# Patient Record
Sex: Female | Born: 1990 | Race: White | Hispanic: No | Marital: Single | Smoking: Never smoker
Health system: Southern US, Community
[De-identification: ages and names within clinical notes are randomized; demographics above are authoritative.]

## PROBLEM LIST (undated history)

## (undated) DIAGNOSIS — F329 Major depressive disorder, single episode, unspecified: Secondary | ICD-10-CM

## (undated) DIAGNOSIS — F32A Depression, unspecified: Secondary | ICD-10-CM

## (undated) HISTORY — DX: Major depressive disorder, single episode, unspecified: F32.9

## (undated) HISTORY — DX: Depression, unspecified: F32.A

---

## 2011-09-07 ENCOUNTER — Ambulatory Visit (INDEPENDENT_AMBULATORY_CARE_PROVIDER_SITE_OTHER): Payer: PRIVATE HEALTH INSURANCE

## 2011-09-07 DIAGNOSIS — J069 Acute upper respiratory infection, unspecified: Secondary | ICD-10-CM

## 2011-12-06 ENCOUNTER — Ambulatory Visit (INDEPENDENT_AMBULATORY_CARE_PROVIDER_SITE_OTHER): Payer: PRIVATE HEALTH INSURANCE | Admitting: Internal Medicine

## 2011-12-06 VITALS — BP 110/68 | HR 68 | Temp 98.2°F | Resp 16 | Ht 64.0 in | Wt 129.0 lb

## 2011-12-06 DIAGNOSIS — J039 Acute tonsillitis, unspecified: Secondary | ICD-10-CM

## 2011-12-06 DIAGNOSIS — F39 Unspecified mood [affective] disorder: Secondary | ICD-10-CM

## 2011-12-06 DIAGNOSIS — R059 Cough, unspecified: Secondary | ICD-10-CM

## 2011-12-06 DIAGNOSIS — J00 Acute nasopharyngitis [common cold]: Secondary | ICD-10-CM

## 2011-12-06 DIAGNOSIS — R05 Cough: Secondary | ICD-10-CM

## 2011-12-06 MED ORDER — HYDROCODONE-ACETAMINOPHEN 7.5-500 MG/15ML PO SOLN: 5.0000 mL | Freq: Four times a day (QID) | ORAL | Status: AC | PRN

## 2011-12-06 MED ORDER — AZITHROMYCIN 500 MG PO TABS: 500.0000 mg | ORAL_TABLET | Freq: Every day | ORAL | Status: AC

## 2011-12-06 NOTE — Progress Notes (Signed)
  Subjective:    Patient ID: Patricia Banks, female    DOB: 1991/01/15, 21 y.o.   MRN: [id number]  HPI Has 2-3d of very st, congestion with bloody green disch, body aches, and mild cough    Review of Systems    mood disorder Objective:   Physical Exam  Constitutional: She is oriented to person, place, and time. She appears well-developed and well-nourished.  HENT:  Right Ear: Tympanic membrane normal.  Left Ear: Tympanic membrane normal.  Nose: Mucosal edema, rhinorrhea and sinus tenderness present. Right sinus exhibits no maxillary sinus tenderness and no frontal sinus tenderness. Left sinus exhibits no maxillary sinus tenderness and no frontal sinus tenderness.  Mouth/Throat: Uvula is midline and mucous membranes are normal. Uvula swelling present. Oropharyngeal exudate, posterior oropharyngeal edema and posterior oropharyngeal erythema present.  Eyes: EOM are normal.  Neck: Neck supple.  Cardiovascular: Normal rate.   Pulmonary/Chest: Effort normal and breath sounds normal.  Lymphadenopathy:    She has cervical adenopathy.  Neurological: She is alert and oriented to person, place, and time.  Skin: Rash noted.  Psychiatric: She has a normal mood and affect.   RST  Results for orders placed in visit on 12/06/11  POCT RAPID STREP A (OFFICE)      Component Value Range   Rapid Strep A Screen Negative  Negative       Assessment & Plan:  Tonsillitis Cough  Zithromax 500mg  Lortab elixir 6oz prn

## 2011-12-06 NOTE — Patient Instructions (Signed)
Tonsillitis Tonsils are lumps of lymphoid tissues at the back of the throat. Each tonsil has 20 crevices (crypts). Tonsils help fight nose and throat infections and keep infection from spreading to other parts of the body for the first 18 months of life. Tonsillitis is an infection of the throat that causes the tonsils to become red, tender, and swollen. CAUSES Sudden and, if treated, temporary (acute) tonsillitis is usually caused by infection with streptococcal bacteria. Long lasting (chronic) tonsillitis occurs when the crypts of the tonsils become filled with pieces of food and bacteria, which makes it easy for the tonsils to become constantly infected. SYMPTOMS  Symptoms of tonsillitis include:  A sore throat.   White patches on the tonsils.   Fever.   Tiredness.  DIAGNOSIS Tonsillitis can be diagnosed through a physical exam. Diagnosis can be confirmed with the results of lab tests, including a throat culture. TREATMENT  The goals of tonsillitis treatment include the reduction of the severity and duration of symptoms, prevention of associated conditions, and prevention of disease transmission. Tonsillitis caused by bacteria can be treated with antibiotics. Usually, treatment with antibiotics is started before the cause of the tonsillitis is known. However, if it is determined that the cause is not bacterial, antibiotics will not treat the tonsillitis. If attacks of tonsillitis are severe and frequent, your caregiver may recommend surgery to remove the tonsils (tonsillectomy). HOME CARE INSTRUCTIONS   Rest as much as possible and get plenty of sleep.   Drink plenty of fluids. While the throat is very sore, eat soft foods or liquids, such as sherbet, soups, or instant breakfast drinks.   Eat frozen ice pops.   Older children and adults may gargle with a warm or cold liquid to help soothe the throat. Mix 1 teaspoon of salt in 1 cup of water.   Other family members who also develop a  sore throat or fever should have a medical exam or throat culture.   Only take over-the-counter or prescription medicines for pain, discomfort, or fever as directed by your caregiver.   If you are given antibiotics, take them as directed. Finish them even if you start to feel better.  SEEK MEDICAL CARE IF:   Your baby is older than 3 months with a rectal temperature of 100.5 F (38.1 C) or higher for more than 1 day.   Large, tender lumps develop in your neck.   A rash develops.   Green, yellow-brown, or bloody substance is coughed up.   You are unable to swallow liquids or food for 24 hours.   Your child is unable to swallow food or liquids for 12 hours.  SEEK IMMEDIATE MEDICAL CARE IF:   You develop any new symptoms such as vomiting, severe headache, stiff neck, chest pain, or trouble breathing or swallowing.   You have severe throat pain along with drooling or voice changes.   You have severe pain, unrelieved with recommended medications.   You are unable to fully open the mouth.   You develop redness, swelling, or severe pain anywhere in the neck.   You have a fever.   Your baby is older than 3 months with a rectal temperature of 102 F (38.9 C) or higher.   Your baby is 12 months old or younger with a rectal temperature of 100.4 F (38 C) or higher.  MAKE SURE YOU:   Understand these instructions.   Will watch your condition.   Will get help right away if you are not  watch your condition.   Will get help right away if you are not doing well or get worse.  Document Released: 06/15/2005 Document Revised: 08/25/2011 Document Reviewed: 11/11/2010  [employer] Patient Information 2012 [employer]

## 2013-08-28 ENCOUNTER — Inpatient Hospital Stay (HOSPITAL_COMMUNITY): Admit: 2013-08-28 | Payer: Self-pay

## 2019-10-07 ENCOUNTER — Other Ambulatory Visit: Payer: Self-pay

## 2019-12-09 ENCOUNTER — Encounter (HOSPITAL_COMMUNITY): Payer: Self-pay | Admitting: Emergency Medicine

## 2019-12-09 ENCOUNTER — Emergency Department (HOSPITAL_COMMUNITY): Payer: BC Managed Care – PPO

## 2019-12-09 ENCOUNTER — Other Ambulatory Visit: Payer: Self-pay

## 2019-12-09 ENCOUNTER — Emergency Department (HOSPITAL_COMMUNITY)
Admission: EM | Admit: 2019-12-09 | Discharge: 2019-12-09 | Disposition: A | Discharge status: Home / Self Care | Payer: BC Managed Care – PPO | Attending: Emergency Medicine | Admitting: Emergency Medicine

## 2019-12-09 DIAGNOSIS — R079 Chest pain, unspecified: Secondary | ICD-10-CM | POA: Diagnosis present

## 2019-12-09 DIAGNOSIS — R0789 Other chest pain: Secondary | ICD-10-CM | POA: Insufficient documentation

## 2019-12-09 LAB — CBC
HCT: 43.7 % (ref 36.0–46.0)
Hemoglobin: 14.3 g/dL (ref 12.0–15.0)
MCH: 29.4 pg (ref 26.0–34.0)
MCHC: 32.7 g/dL (ref 30.0–36.0)
MCV: 89.9 fL (ref 80.0–100.0)
Platelets: 316 10*3/uL (ref 150–400)
RBC: 4.86 MIL/uL (ref 3.87–5.11)
RDW: 11.1 % — ABNORMAL LOW (ref 11.5–15.5)
WBC: 7.4 10*3/uL (ref 4.0–10.5)
nRBC: 0 % (ref 0.0–0.2)

## 2019-12-09 LAB — BASIC METABOLIC PANEL
Anion gap: 9 (ref 5–15)
BUN: 14 mg/dL (ref 6–20)
CO2: 23 mmol/L (ref 22–32)
Calcium: 9.6 mg/dL (ref 8.9–10.3)
Chloride: 107 mmol/L (ref 98–111)
Creatinine, Ser: 0.67 mg/dL (ref 0.44–1.00)
GFR calc Af Amer: >60 mL/min (ref >60)
GFR calc non Af Amer: >60 mL/min (ref >60)
Glucose, Bld: 95 mg/dL (ref 70–99)
Potassium: 4.6 mmol/L (ref 3.5–5.1)
Sodium: 139 mmol/L (ref 135–145)

## 2019-12-09 LAB — I-STAT BETA HCG BLOOD, ED (MC, WL, AP ONLY): I-stat hCG, quantitative: <5 m[IU]/mL (ref <5)

## 2019-12-09 LAB — TROPONIN I (HIGH SENSITIVITY): Troponin I (High Sensitivity): <2 ng/L (ref <18)

## 2019-12-09 MED ORDER — SODIUM CHLORIDE 0.9% FLUSH: 3.0000 mL | Freq: Once | INTRAVENOUS | Status: DC

## 2019-12-09 NOTE — ED Triage Notes (Signed)
Pt here with c/o left sided chest pain and shoulder pain some slight sob , no nausea

## 2019-12-09 NOTE — ED Notes (Signed)
EDP [place] relied to this RN that pt did not need a second troponin drawn.

## 2019-12-09 NOTE — ED Provider Notes (Signed)
[facility] EMERGENCY DEPART[facility] Provider Note   CSN: [id number] Arrival date & time: 12/09/19  1415     History Chief Complaint  Patient presents with  . Chest Pain    Patricia Banks is a 29 y.o. female.  The history is provided by the patient.  Chest Pain Pain location:  L chest Pain quality: aching   Pain radiates to:  Does not radiate Pain severity:  Mild Onset quality:  Gradual Duration:  3 days Timing:  Intermittent Progression:  Waxing and waning Chronicity:  New Context: at rest   Relieved by:  Nothing Worsened by:  Nothing Associated symptoms: no abdominal pain, no back pain, no cough, no fever, no palpitations, no shortness of breath and no vomiting   Risk factors: no birth control, no coronary artery disease, no high cholesterol, no hypertension, not pregnant and no prior DVT/PE        No past medical history on file.  There are no problems to display for this patient.      OB History   No obstetric history on file.     No family history on file.  Social History   Tobacco Use  . Smoking status: Never Smoker  . Smokeless tobacco: Never Used  Substance Use Topics  . Alcohol use: Not on file  . Drug use: Not on file    Home Medications Prior to Admission medications   Not on File    Allergies    Patient has no known allergies.  Review of Systems   Review of Systems  Constitutional: Negative for chills and fever.  HENT: Negative for ear pain and sore throat.   Eyes: Negative for pain and visual disturbance.  Respiratory: Negative for cough and shortness of breath.   Cardiovascular: Positive for chest pain. Negative for palpitations.  Gastrointestinal: Negative for abdominal pain and vomiting.  Genitourinary: Negative for dysuria and hematuria.  Musculoskeletal: Negative for arthralgias and back pain.  Skin: Negative for color change and rash.  Neurological: Negative for seizures and syncope.  All other systems  reviewed and are negative.   Physical Exam Updated Vital Signs  ED Triage Vitals  Enc Vitals Group     BP 12/09/19 1609 123/80     Pulse Rate 12/09/19 1609 87     Resp 12/09/19 1609 16     Temp 12/09/19 1609 97.9 F (36.6 C)     Temp Source 12/09/19 1609 Oral     SpO2 12/09/19 1609 98 %     Weight --      Height --      Head Circumference --      Peak Flow --      Pain Score 12/09/19 1429 6     Pain Loc --      Pain Edu? --      Excl. in [place]? --     Physical Exam Vitals and nursing note reviewed.  Constitutional:      General: She is not in acute distress.    Appearance: She is well-developed. She is not ill-appearing.  HENT:     Head: Normocephalic and atraumatic.  Eyes:     Conjunctiva/sclera: Conjunctivae normal.     Pupils: Pupils are equal, round, and reactive to light.  Cardiovascular:     Rate and Rhythm: Normal rate and regular rhythm.     Pulses:          Radial pulses are 2+ on the right side and 2+ on the  left side.     Heart sounds: Normal heart sounds. No murmur.  Pulmonary:     Effort: Pulmonary effort is normal. No respiratory distress.     Breath sounds: Normal breath sounds. No decreased breath sounds, wheezing or rhonchi.  Abdominal:     Palpations: Abdomen is soft.     Tenderness: There is no abdominal tenderness.  Musculoskeletal:        General: Normal range of motion.     Cervical back: Normal range of motion and neck supple.     Right lower leg: No edema.     Left lower leg: No edema.  Skin:    General: Skin is warm and dry.     Capillary Refill: Capillary refill takes less than 2 seconds.  Neurological:     General: No focal deficit present.     Mental Status: She is alert.     ED Results / Procedures / Treatments   Labs (all labs ordered are listed, but only abnormal results are displayed) Labs Reviewed  CBC - Abnormal; Notable for the following components:      Result Value   RDW 11.1 (*)    All other components within normal  limits  BASIC METABOLIC PANEL  I-STAT BETA HCG BLOOD, ED ([facility], [facility], [facility] ONLY)  TROPONIN I (HIGH SENSITIVITY)    EKG EKG Interpretation  Date/Time:  Monday December 09 2019 14:23:46 EDT Ventricular Rate:  97 PR Interval:  152 QRS Duration: 80 QT Interval:  344 QTC Calculation: 436 R Axis:   35 Text Interpretation: Normal sinus rhythm Normal ECG Confirmed by Virgina Norfolk [phone]) on 12/09/2019 4:25:21 PM   Radiology DG Chest 2 View  Result Date: 12/09/2019 CLINICAL DATA:  Chest pain for 2 days, initial encounter EXAM: CHEST - 2 VIEW COMPARISON:  None. FINDINGS: Cardiac shadow is within normal limits. The lungs are well aerated bilaterally. No focal infiltrate or sizable effusion is seen. No bony abnormality is noted. IMPRESSION: No active cardiopulmonary disease. Electronically Signed   By: Alcide Clever M.D.   On: 12/09/2019 16:39    Procedures Procedures (including critical care time)  Medications Ordered in ED Medications  sodium chloride flush (NS) 0.9 % injection 3 mL (0 mLs Intravenous Hold 12/09/19 1652)    ED Course  I have reviewed the triage vital signs and the nursing notes.  Pertinent labs & imaging results that were available during my care of the patient were reviewed by me and considered in my medical decision making (see chart for details).    MDM Rules/Calculators/A&P                      Patricia Banks is a 29 year old female with history of ADHD who presents the ED with chest pain.  Patient with chest pain for the last several days, on and off.  PERC negative and doubt PE.  No cardiac risk factors.  EKG shows sinus rhythm.  Normal troponin.  Heart score 0, doubt ACS.  Chest x-ray with no signs of infection, no pneumothorax.  Likely GI or muscle related pain.  Patient has good pulses throughout.  Overall normal exam.  Recommend Tylenol Motrin.  Given reassurance and discharged from the ED in good condition. Given return precautions.   This chart was dictated using  voice recognition software.  Despite best efforts to proofread,  errors can occur which can change the documentation meaning.   Final Clinical Impression(s) / ED Diagnoses Final diagnoses:  Atypical chest  pain    Rx / DC Orders ED Discharge Orders    None       Virgina Norfolk, DO 12/09/19 1752

## 2020-05-19 ENCOUNTER — Other Ambulatory Visit: Payer: Self-pay | Admitting: *Deleted

## 2020-05-19 ENCOUNTER — Other Ambulatory Visit: Payer: Self-pay

## 2020-05-19 DIAGNOSIS — Z20822 Contact with and (suspected) exposure to covid-19: Secondary | ICD-10-CM

## 2020-05-20 LAB — NOVEL CORONAVIRUS, NAA: SARS-CoV-2, NAA: NOT DETECTED

## 2020-06-15 ENCOUNTER — Other Ambulatory Visit: Payer: BC Managed Care – PPO

## 2020-06-15 DIAGNOSIS — Z20822 Contact with and (suspected) exposure to covid-19: Secondary | ICD-10-CM

## 2020-06-17 LAB — SARS-COV-2, NAA 2 DAY TAT

## 2020-06-17 LAB — NOVEL CORONAVIRUS, NAA: SARS-CoV-2, NAA: NOT DETECTED

## 2021-04-05 IMAGING — DX DG CHEST 2V
2 series · 2 of 2 positions shown · non-contrast
Comparison: None.

CLINICAL DATA: Chest pain for 2 days, initial encounter

EXAM:
CHEST - 2 VIEW

[w chest pa]
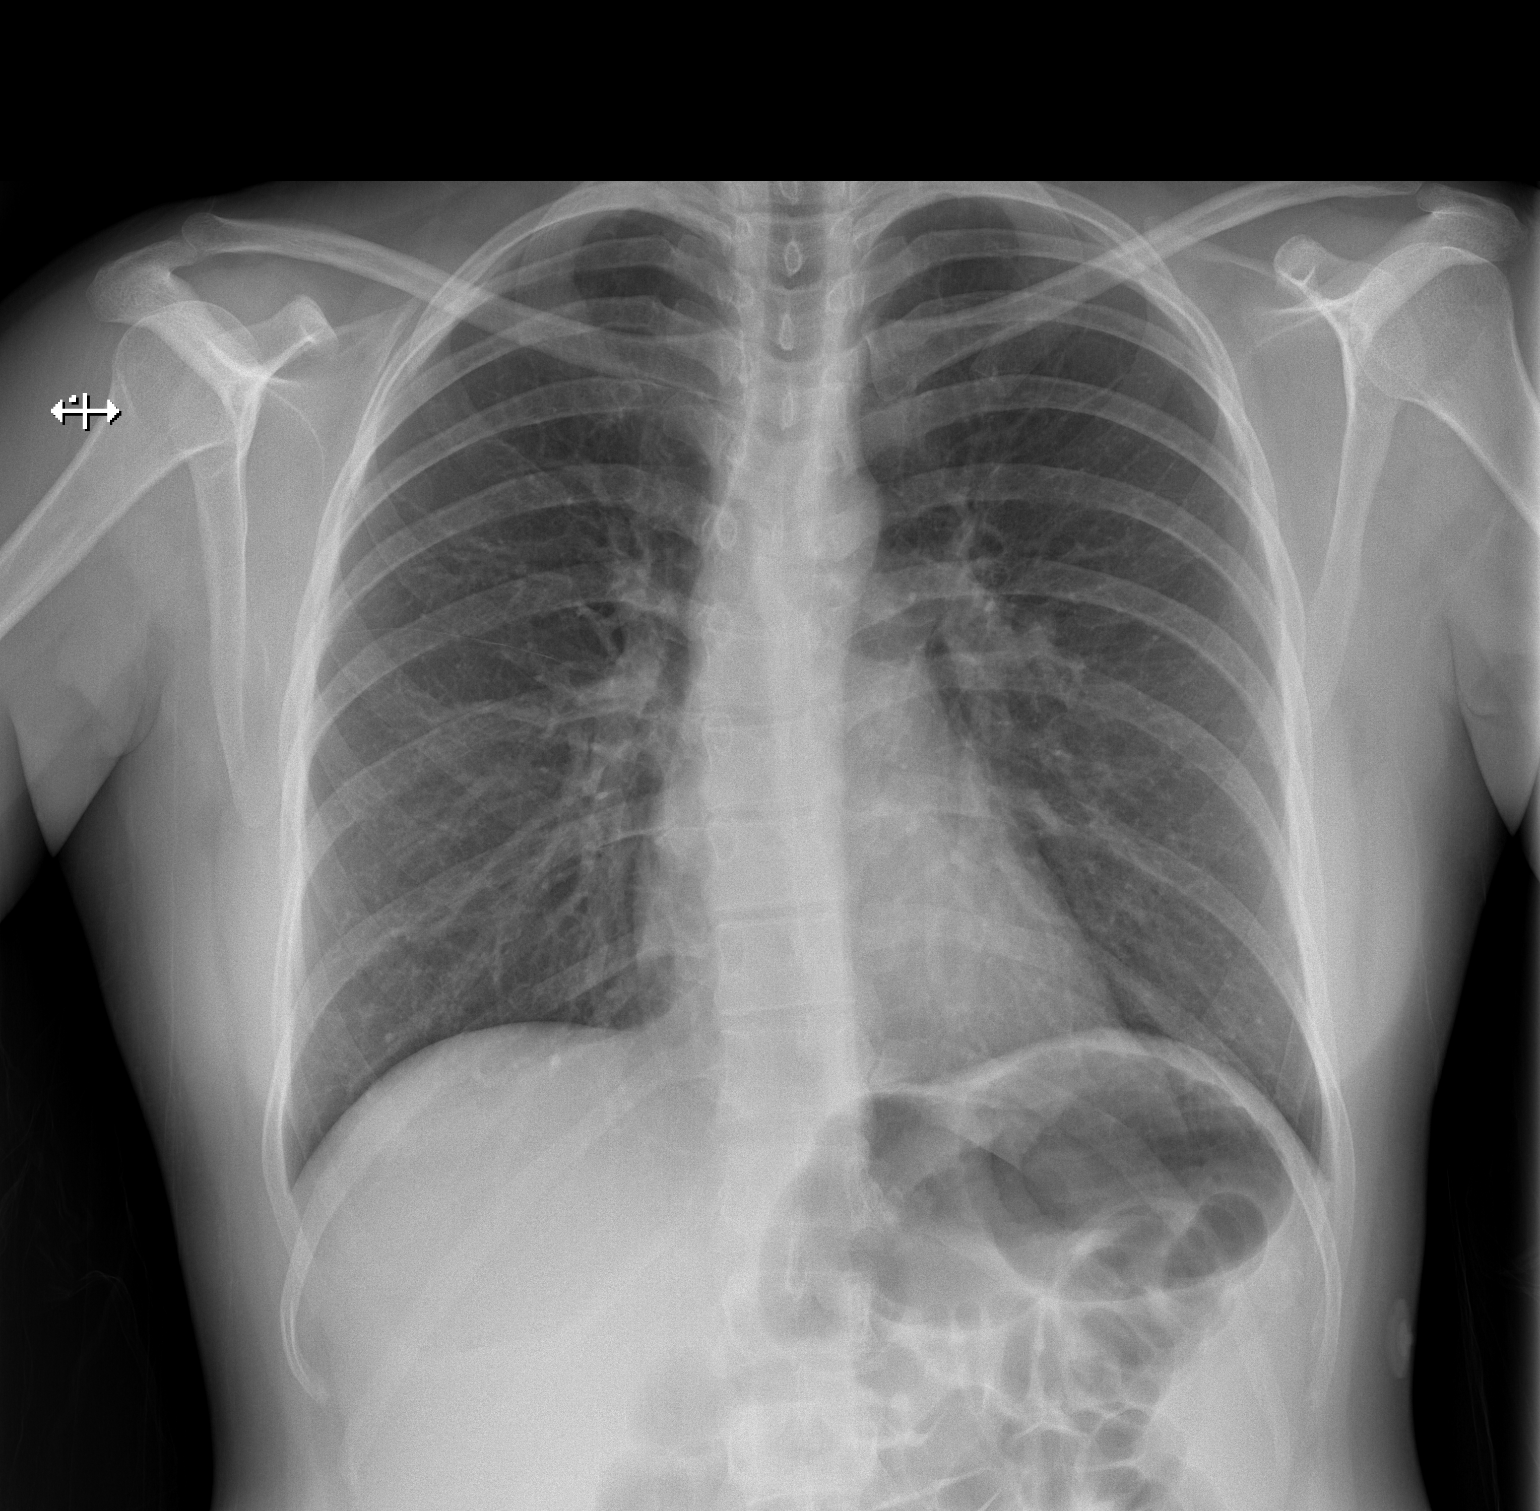

[w chest lat]
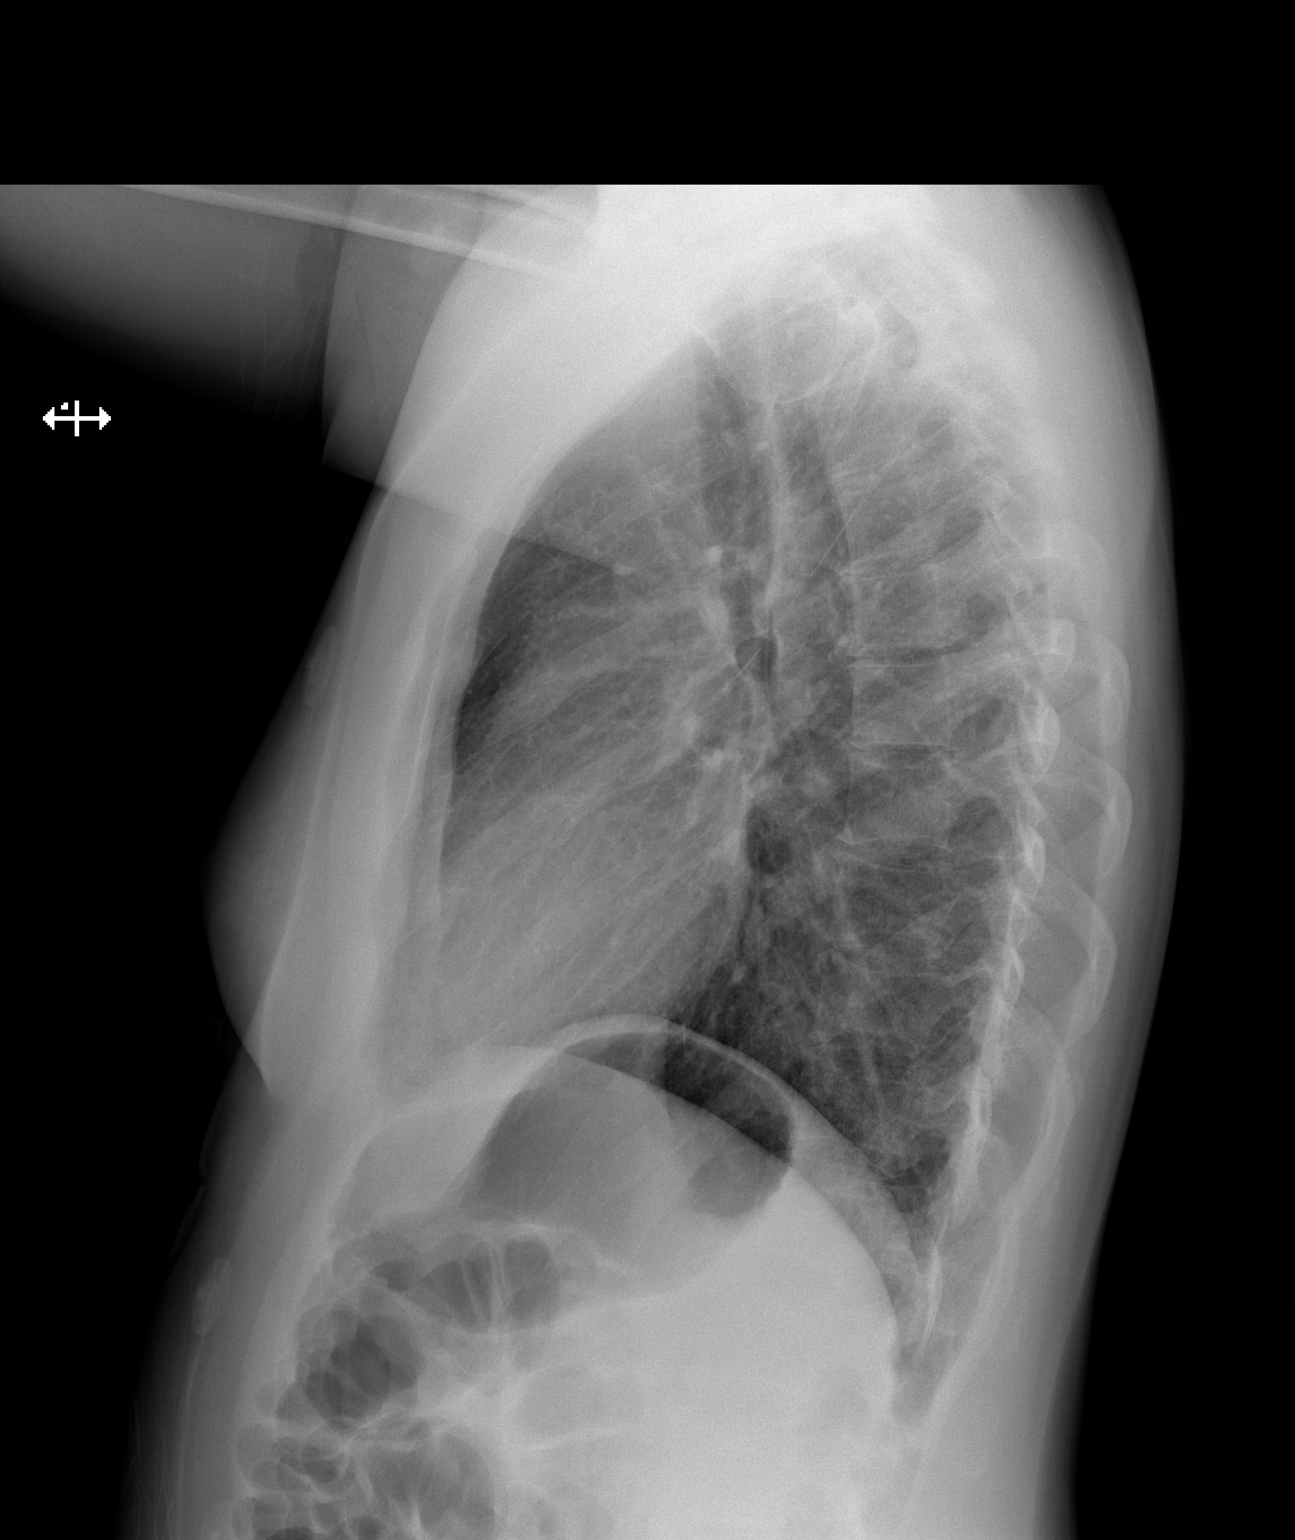

[2 of 2 positions shown; findings below may reference images not displayed]

FINDINGS: Cardiac shadow is within normal limits. The lungs are well aerated
bilaterally. No focal infiltrate or sizable effusion is seen. No
bony abnormality is noted.
IMPRESSION: No active cardiopulmonary disease.
